# Patient Record
Sex: Male | Born: 1980 | Race: Black or African American | Hispanic: No | Marital: Single | Smoking: Never smoker
Health system: Southern US, Community
[De-identification: ages and names within clinical notes are randomized; demographics above are authoritative.]

## PROBLEM LIST (undated history)

## (undated) HISTORY — PX: TONSILLECTOMY: SUR1361

---

## 2015-05-10 ENCOUNTER — Encounter (HOSPITAL_COMMUNITY): Payer: Self-pay | Admitting: Emergency Medicine

## 2015-05-10 ENCOUNTER — Emergency Department (HOSPITAL_COMMUNITY): Payer: BLUE CROSS/BLUE SHIELD

## 2015-05-10 ENCOUNTER — Emergency Department (HOSPITAL_COMMUNITY)
Admission: EM | Admit: 2015-05-10 | Discharge: 2015-05-10 | Disposition: A | Discharge status: Home / Self Care | Payer: BLUE CROSS/BLUE SHIELD | Attending: Emergency Medicine | Admitting: Emergency Medicine

## 2015-05-10 DIAGNOSIS — R0602 Shortness of breath: Secondary | ICD-10-CM | POA: Diagnosis present

## 2015-05-10 DIAGNOSIS — J159 Unspecified bacterial pneumonia: Secondary | ICD-10-CM | POA: Insufficient documentation

## 2015-05-10 DIAGNOSIS — J9801 Acute bronchospasm: Secondary | ICD-10-CM | POA: Diagnosis not present

## 2015-05-10 DIAGNOSIS — J189 Pneumonia, unspecified organism: Secondary | ICD-10-CM

## 2015-05-10 LAB — CBC WITH DIFFERENTIAL/PLATELET
BASOS ABS: 0 10*3/uL (ref 0.0–0.1)
BASOS PCT: 0 %
Eosinophils Absolute: 0.7 10*3/uL (ref 0.0–0.7)
Eosinophils Relative: 15 %
HEMATOCRIT: 42.7 % (ref 39.0–52.0)
Hemoglobin: 14.6 g/dL (ref 13.0–17.0)
Lymphocytes Relative: 35 %
Lymphs Abs: 1.7 10*3/uL (ref 0.7–4.0)
MCH: 30 pg (ref 26.0–34.0)
MCHC: 34.2 g/dL (ref 30.0–36.0)
MCV: 87.7 fL (ref 78.0–100.0)
Monocytes Absolute: 0.4 10*3/uL (ref 0.1–1.0)
Monocytes Relative: 7 %
NEUTROS ABS: 2.1 10*3/uL (ref 1.7–7.7)
NEUTROS PCT: 43 %
Platelets: 171 10*3/uL (ref 150–400)
RBC: 4.87 MIL/uL (ref 4.22–5.81)
RDW: 12.6 % (ref 11.5–15.5)
WBC: 4.8 10*3/uL (ref 4.0–10.5)

## 2015-05-10 LAB — BASIC METABOLIC PANEL
ANION GAP: 6 (ref 5–15)
BUN: 23 mg/dL — ABNORMAL HIGH (ref 6–20)
CALCIUM: 8.7 mg/dL — AB (ref 8.9–10.3)
CO2: 27 mmol/L (ref 22–32)
Chloride: 107 mmol/L (ref 101–111)
Creatinine, Ser: 1.82 mg/dL — ABNORMAL HIGH (ref 0.61–1.24)
GFR, EST AFRICAN AMERICAN: 54 mL/min — AB (ref >60)
GFR, EST NON AFRICAN AMERICAN: 46 mL/min — AB (ref >60)
GLUCOSE: 101 mg/dL — AB (ref 65–99)
POTASSIUM: 3.3 mmol/L — AB (ref 3.5–5.1)
Sodium: 140 mmol/L (ref 135–145)

## 2015-05-10 MED ORDER — PREDNISONE 20 MG PO TABS: 40.0000 mg | ORAL_TABLET | Freq: Every day | ORAL | Status: AC

## 2015-05-10 MED ORDER — LORATADINE 10 MG PO TABS: 10.0000 mg | ORAL_TABLET | Freq: Every day | ORAL | Status: DC

## 2015-05-10 MED ORDER — DEXTROSE 5 % IV SOLN
1.0000 g | Freq: Once | INTRAVENOUS | Status: AC
  Administered 2015-05-10: 1 g via INTRAVENOUS
  Filled 2015-05-10: qty 10

## 2015-05-10 MED ORDER — AZITHROMYCIN 250 MG PO TABS: 250.0000 mg | ORAL_TABLET | Freq: Every day | ORAL | Status: AC

## 2015-05-10 MED ORDER — LORATADINE 10 MG PO TABS: 10.0000 mg | ORAL_TABLET | Freq: Every day | ORAL | Status: AC

## 2015-05-10 MED ORDER — ONDANSETRON HCL 4 MG/2ML IJ SOLN
4.0000 mg | Freq: Once | INTRAMUSCULAR | Status: AC
  Administered 2015-05-10: 4 mg via INTRAVENOUS
  Filled 2015-05-10: qty 2

## 2015-05-10 MED ORDER — ALBUTEROL (5 MG/ML) CONTINUOUS INHALATION SOLN
10.0000 mg/h | INHALATION_SOLUTION | RESPIRATORY_TRACT | Status: AC
  Administered 2015-05-10: 10 mg/h via RESPIRATORY_TRACT
  Filled 2015-05-10: qty 20

## 2015-05-10 MED ORDER — METHYLPREDNISOLONE SODIUM SUCC 125 MG IJ SOLR
125.0000 mg | Freq: Once | INTRAMUSCULAR | Status: AC
  Administered 2015-05-10: 125 mg via INTRAVENOUS
  Filled 2015-05-10: qty 2

## 2015-05-10 MED ORDER — ALBUTEROL SULFATE (2.5 MG/3ML) 0.083% IN NEBU
5.0000 mg | INHALATION_SOLUTION | Freq: Once | RESPIRATORY_TRACT | Status: AC
  Administered 2015-05-10: 5 mg via RESPIRATORY_TRACT
  Filled 2015-05-10: qty 6

## 2015-05-10 MED ORDER — SODIUM CHLORIDE 0.9 % IV BOLUS (SEPSIS)
1000.0000 mL | Freq: Once | INTRAVENOUS | Status: AC
  Administered 2015-05-10: 1000 mL via INTRAVENOUS

## 2015-05-10 MED ORDER — IPRATROPIUM BROMIDE 0.02 % IN SOLN
0.5000 mg | Freq: Once | RESPIRATORY_TRACT | Status: AC
  Administered 2015-05-10: 0.5 mg via RESPIRATORY_TRACT
  Filled 2015-05-10: qty 2.5

## 2015-05-10 MED ORDER — ALBUTEROL SULFATE HFA 108 (90 BASE) MCG/ACT IN AERS
2.0000 | INHALATION_SPRAY | Freq: Once | RESPIRATORY_TRACT | Status: AC
  Administered 2015-05-10: 2 via RESPIRATORY_TRACT
  Filled 2015-05-10: qty 6.7

## 2015-05-10 MED ORDER — MAGNESIUM SULFATE 2 GM/50ML IV SOLN: 2.0000 g | INTRAVENOUS | Status: DC

## 2015-05-10 NOTE — ED Notes (Addendum)
Pt ambulated over 50 ft.  Pt's O2 remained at 91% and  92%.  Pt's pulse rate was 87.   Pt maintained a steady gait and ambulated without assistance.

## 2015-05-10 NOTE — ED Notes (Signed)
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 Pt has no respiratory history or asthma. Pt has bilateral wheezes and diminished lung sounds. Pt is also bradycardic at time of assessment. Pt has no known history of any cardiac issues

## 2015-05-10 NOTE — Discharge Instructions (Signed)
Take azithromycin and prednisone as prescribed. Use an albuterol inhaler, 2 puffs every 4-6 hours, as needed for cough and shortness of breath. Follow up with your primary care doctor. Return if symptoms worsen.  Community-Acquired Pneumonia, Adult Pneumonia is an infection of the lungs. There are different types of pneumonia. One type can develop while a person is in a hospital. A different type, called community-acquired pneumonia, develops in people who are not, or have not recently been, in the hospital or other health care facility.  CAUSES Pneumonia may be caused by bacteria, viruses, or funguses. Community-acquired pneumonia is often caused by Streptococcus pneumonia bacteria. These bacteria are often passed from one person to another by breathing in droplets from the cough or sneeze of an infected person. RISK FACTORS The condition is more likely to develop in:  People who havechronic diseases, such as chronic obstructive pulmonary disease (COPD), asthma, congestive heart failure, cystic fibrosis, diabetes, or kidney disease.  People who haveearly-stage or late-stage HIV.  People who havesickle cell disease.  People who havehad their spleen removed (splenectomy).  People who havepoor Administrator.  People who havemedical conditions that increase the risk of breathing in (aspirating) secretions their own mouth and nose.   People who havea weakened immune system (immunocompromised).  People who smoke.  People whotravel to areas where pneumonia-causing germs commonly exist.  People whoare around animal habitats or animals that have pneumonia-causing germs, including birds, bats, rabbits, cats, and farm animals. SYMPTOMS Symptoms of this condition include:  Adry cough.  A wet (productive) cough.  Fever.  Sweating.  Chest pain, especially when breathing deeply or coughing.  Rapid breathing or difficulty breathing.  Shortness of breath.  Shaking  chills.  Fatigue.  Muscle aches. DIAGNOSIS Your health care provider will take a medical history and perform a physical exam. You may also have other tests, including:  Imaging studies of your chest, including X-rays.  Tests to check your blood oxygen level and other blood gases.  Other tests on blood, mucus (sputum), fluid around your lungs (pleural fluid), and urine. If your pneumonia is severe, other tests may be done to identify the specific cause of your illness. TREATMENT The type of treatment that you receive depends on many factors, such as the cause of your pneumonia, the medicines you take, and other medical conditions that you have. For most adults, treatment and recovery from pneumonia may occur at home. In some cases, treatment must happen in a hospital. Treatment may include:  Antibiotic medicines, if the pneumonia was caused by bacteria.  Antiviral medicines, if the pneumonia was caused by a virus.  Medicines that are given by mouth or through an IV tube.  Oxygen.  Respiratory therapy. Although rare, treating severe pneumonia may include:  Mechanical ventilation. This is done if you are not breathing well on your own and you cannot maintain a safe blood oxygen level.  Thoracentesis. This procedureremoves fluid around one lung or both lungs to help you breathe better. HOME CARE INSTRUCTIONS  Take over-the-counter and prescription medicines only as told by your health care provider.  Only takecough medicine if you are losing sleep. Understand that cough medicine can prevent your body's natural ability to remove mucus from your lungs.  If you were prescribed an antibiotic medicine, take it as told by your health care provider. Do not stop taking the antibiotic even if you start to feel better.  Sleep in a semi-upright position at night. Try sleeping in a reclining chair, or place a  few pillows under your head.  Do not use tobacco products, including cigarettes,  chewing tobacco, and e-cigarettes. If you need help quitting, ask your health care provider.  Drink enough water to keep your urine clear or pale yellow. This will help to thin out mucus secretions in your lungs. PREVENTION There are ways that you can decrease your risk of developing community-acquired pneumonia. Consider getting a pneumococcal vaccine if:  You are older than 37 years of age.  You are older than 37 years of age and are undergoing cancer treatment, have chronic lung disease, or have other medical conditions that affect your immune system. Ask your health care provider if this applies to you. There are different types and schedules of pneumococcal vaccines. Ask your health care provider which vaccination option is best for you. You may also prevent community-acquired pneumonia if you take these actions:  Get an influenza vaccine every year. Ask your health care provider which type of influenza vaccine is best for you.  Go to the dentist on a regular basis.  Wash your hands often. Use hand sanitizer if soap and water are not available. SEEK MEDICAL CARE IF:  You have a fever.  You are losing sleep because you cannot control your cough with cough medicine. SEEK IMMEDIATE MEDICAL CARE IF:  You have worsening shortness of breath.  You have increased chest pain.  Your sickness becomes worse, especially if you are an older adult or have a weakened immune system.  You cough up blood.   This information is not intended to replace advice given to you by your health care provider. Make sure you discuss any questions you have with your health care provider.   Document Released: 01/10/2005 Document Revised: 10/01/2014 Document Reviewed: 05/07/2014 Elsevier Interactive Patient Education [employer]2016 [employer] Inc.

## 2015-05-10 NOTE — ED Provider Notes (Signed)
CSN: [id number]     Arrival date & time 05/10/15  0019 History   First MD Initiated Contact with Patient 05/10/15 0303     Chief Complaint  Patient presents with  . Shortness of Breath     (Consider location/radiation/quality/duration/timing/severity/associated sxs/prior Treatment) HPI Comments: Patient is an otherwise healthy 35 year old male who presents to the emergency department for worsening shortness of breath and chest tightness. Patient states that his symptoms have been progressive over the past 4 days. He reports constant worsening symptoms and denies taking any medications prior to arrival for relief. He denies a history of asthma and states that he has been a never smoker. He further denies fever, cough, nasal congestion, rhinorrhea, nausea, vomiting, or history of seasonal allergies. He has not been around any sick contacts. Albuterol treatments given in triage. Patient reports mild improvement with this.  Patient is a 35 y.o. male presenting with shortness of breath. The history is provided by the patient. No language interpreter was used.  Shortness of Breath Associated symptoms: wheezing   Associated symptoms: no cough, no fever and no vomiting     History reviewed. No pertinent past medical history. Past Surgical History  Procedure Laterality Date  . Tonsillectomy     No family history on file. Social History  Substance Use Topics  . Smoking status: Never Smoker   . Smokeless tobacco: None  . Alcohol Use: No    Review of Systems  Constitutional: Negative for fever.  HENT: Negative for congestion and rhinorrhea.   Respiratory: Positive for chest tightness, shortness of breath and wheezing. Negative for cough.   Gastrointestinal: Negative for nausea and vomiting.  Neurological: Negative for syncope.  All other systems reviewed and are negative.   Allergies  Shellfish allergy  Home Medications   Prior to Admission medications   Medication Sig Start Date  End Date Taking? Authorizing Provider  aspirin-acetaminophen-caffeine (EXCEDRIN MIGRAINE) [phone] MG tablet Take 2 tablets by mouth every 6 (six) hours as needed (for current symptoms--shortness of breath).   Yes Historical Provider, MD  OVER THE COUNTER MEDICATION Take 1 scoop by mouth 3 (three) times a week. C4 Pre Workout Supplement Ingredients:  Vitamin C (as Ascorbic Acid) Niacin (as Niacinamide) Folic Acid  Calcium (as Calcium Silicate) Vitamin B6 (as pyridoxal-5-phosphate)   Vitamin B12 (as Methylcobalamin)   Citrulline Malate  CarnoSyn Beta-Alanine  Creatine Nitrate (NO3-T)  Betaine Anhydrous   Nitrosigine Inositol-Stabilized Arginine Silicate  Arginine Alpha Ketoglutarate   Taurine  C4 [employer] Focus Blend     N-Acetyl-L-Tyrosine      Caffeine Anhydrous    Velvet Bean (Mucuna pruriens) seed extract (standardized for L-Dopa)     Theacrine (as TeaCrine)      AlphaSize; A-GPC (Alpha-Glyceryl Phosphoryl Choline)      Zembrin (Sceletium tortuosum) aerial parts extract      Rauwolfia vomitoria root bark extract      Huperzine A (from Toothed Clubmoss (Huperzia serrata) aerial parts extract)   Yes Historical Provider, MD  azithromycin (ZITHROMAX) 250 MG tablet Take 1 tablet (250 mg total) by mouth daily. Take first 2 tablets together, then 1 every day until finished. 05/10/15   Antony Madura, PA-C  loratadine (CLARITIN) 10 MG tablet Take 1 tablet (10 mg total) by mouth daily. 05/10/15   Antony Madura, PA-C  predniSONE (DELTASONE) 20 MG tablet Take 2 tablets (40 mg total) by mouth daily. 05/10/15   Antony Madura, PA-C   BP 133/67 mmHg  Pulse 81  Temp(Src) 97.9  F (36.6 C) (Oral)  Resp 28  Ht 6\' 3"  (1.905 m)  Wt 104.327 kg  BMI 28.75 kg/m2  SpO2 93%   Physical Exam  Constitutional: He is oriented to person, place, and time. He appears well-developed and well-nourished. No distress.  Nontoxic/nonseptic appearing. Patient in no acute distress.  HENT:  Head: Normocephalic  and atraumatic.  Mouth/Throat: Oropharynx is clear and moist. No oropharyngeal exudate.  Eyes: Conjunctivae and EOM are normal. No scleral icterus.  Neck: Normal range of motion.  Cardiovascular: Normal rate, regular rhythm and intact distal pulses.   Pulmonary/Chest: Effort normal. No respiratory distress. He has wheezes. He has no rales.  Diffuse expiratory wheezing heard in all lung fields bilaterally. No rales appreciated. Chest expansion symmetric. No accessory muscle use  Musculoskeletal: Normal range of motion.  Neurological: He is alert and oriented to person, place, and time. He exhibits normal muscle tone. Coordination normal.  GCS 15. Patient moving all extremities.  Skin: Skin is warm and dry. No rash noted. He is not diaphoretic. No erythema. No pallor.  Psychiatric: He has a normal mood and affect. His behavior is normal.  Nursing note and vitals reviewed.   ED Course  Procedures (including critical care time) Labs Review Labs Reviewed  BASIC METABOLIC PANEL - Abnormal; Notable for the following:    Potassium 3.3 (*)    Glucose, Bld 101 (*)    BUN 23 (*)    Creatinine, Ser 1.82 (*)    Calcium 8.7 (*)    GFR calc non Af Amer 46 (*)    GFR calc Af Amer 54 (*)    All other components within normal limits  CBC WITH DIFFERENTIAL/PLATELET    Imaging Review Dg Chest 2 View  05/10/2015  CLINICAL DATA:  Initial evaluation for acute cough, shortness of breath, wheezing. EXAM: CHEST  2 VIEW COMPARISON:  None. FINDINGS: Cardiac and mediastinal silhouettes are within normal limits. Trach air column midline and patent. Lungs are normally inflated. There is hazy opacity/infiltrate within the perihilar region in the right upper lobe, suspicious for possible pneumonia. No other definite focal airspace disease. No pulmonary edema or pleural effusion. No pneumothorax. No acute osseous abnormality. IMPRESSION: Hazy right upper lobe opacity, suspicious for possible pneumonia given the  provided history of cough and shortness of breath. Electronically Signed   By: Rise MuBenjamin  McClintock M.D.   On: 05/10/2015 02:34   I have personally reviewed and evaluated these images and lab results as part of my medical decision-making.   EKG Interpretation   Date/Time:  Sunday May 10 2015 00:28:34 EDT Ventricular Rate:  49 PR Interval:  198 QRS Duration: 110 QT Interval:  434 QTC Calculation: 392 R Axis:   73 Text Interpretation:  Sinus bradycardia Confirmed by HORTON  MD, Toni AmendOURTNEY  ([id number]) on 05/10/2015 4:41:15 AM      MDM   Final diagnoses:  CAP (community acquired pneumonia)  Acute bronchospasm    35 year old afebrile and nontoxic appearing male presents to the emergency department for further evaluation of worsening shortness of breath x 4 days. Patient with expiratory wheezing diffusely on initial auscultation following 2 albuterol nebulizer treatments. Patient with chest x-ray that shows a hazy right upper lobe opacity concerning for possible pneumonia. Patient has been given an initial dose of IV Rocephin. Plan to discharge with azithromycin.  Patient with mild to moderate improvement in lung sounds on reexamination following continuous albuterol treatment and IV steroids. Patient also given IV fluids in light of his  elevated creatinine. Laboratory workup is otherwise noncontributory. Patient is ambulatory in the emergency department without hypoxia. He states that he is comfortable with discharge and further outpatient management. I have requested that he return to the emergency department for worsening symptoms, especially if he feels as though his shortness of breath is not well controlled with an albuterol inhaler. Return precautions given at discharge. Patient discharged in satisfactory condition with no unaddressed concerns.   Filed Vitals:   05/10/15 0600 05/10/15 0615 05/10/15 0630 05/10/15 0701  BP: 125/59 126/67 128/70 133/67  Pulse: 86  86 81  Temp:    97.9 F  (36.6 C)  TempSrc:    Oral  Resp: Height:      Weight:      SpO2: 87%  91% 93%     Antony Madura, PA-C 05/10/15 1610  Shon Baton, MD 05/10/15 [phone]

## 2016-05-14 ENCOUNTER — Emergency Department (HOSPITAL_COMMUNITY)
Admission: EM | Admit: 2016-05-14 | Discharge: 2016-05-14 | Disposition: A | Discharge status: Home / Self Care | Payer: BLUE CROSS/BLUE SHIELD | Attending: Emergency Medicine | Admitting: Emergency Medicine

## 2016-05-14 ENCOUNTER — Emergency Department (HOSPITAL_COMMUNITY): Payer: BLUE CROSS/BLUE SHIELD

## 2016-05-14 ENCOUNTER — Encounter (HOSPITAL_COMMUNITY): Payer: Self-pay | Admitting: Oncology

## 2016-05-14 DIAGNOSIS — Z79899 Other long term (current) drug therapy: Secondary | ICD-10-CM | POA: Insufficient documentation

## 2016-05-14 DIAGNOSIS — R0602 Shortness of breath: Secondary | ICD-10-CM

## 2016-05-14 DIAGNOSIS — Z7982 Long term (current) use of aspirin: Secondary | ICD-10-CM | POA: Insufficient documentation

## 2016-05-14 DIAGNOSIS — R062 Wheezing: Secondary | ICD-10-CM | POA: Diagnosis not present

## 2016-05-14 DIAGNOSIS — J208 Acute bronchitis due to other specified organisms: Secondary | ICD-10-CM

## 2016-05-14 MED ORDER — ALBUTEROL SULFATE HFA 108 (90 BASE) MCG/ACT IN AERS: 2.0000 | INHALATION_SPRAY | RESPIRATORY_TRACT | 0 refills | Status: AC | PRN

## 2016-05-14 MED ORDER — ALBUTEROL SULFATE HFA 108 (90 BASE) MCG/ACT IN AERS
2.0000 | INHALATION_SPRAY | Freq: Once | RESPIRATORY_TRACT | Status: AC
  Administered 2016-05-14: 2 via RESPIRATORY_TRACT
  Filled 2016-05-14: qty 6.7

## 2016-05-14 MED ORDER — ALBUTEROL SULFATE (2.5 MG/3ML) 0.083% IN NEBU
5.0000 mg | INHALATION_SOLUTION | Freq: Once | RESPIRATORY_TRACT | Status: AC
  Administered 2016-05-14: 5 mg via RESPIRATORY_TRACT
  Filled 2016-05-14: qty 6

## 2016-05-14 NOTE — ED Provider Notes (Signed)
[facility]-EMERGENCY DEPT Provider Note   CSN: [id number] Arrival date & time: 05/14/16  4098     History   Chief Complaint Chief Complaint  Patient presents with  . Shortness of Breath    HPI Andre Suarez. is a 36 y.o. male.  The history is provided by the patient.  Shortness of Breath  This is a new problem. The average episode lasts 3 days. The problem occurs frequently.The current episode started more than 2 days ago. The problem has not changed since onset.Associated symptoms include rhinorrhea, cough and wheezing. Pertinent negatives include no fever, no sore throat, no sputum production, no hemoptysis, no PND, no orthopnea, no chest pain, no syncope, no vomiting, no abdominal pain, no leg pain and no leg swelling. It is unknown what precipitated the problem. He has tried beta-agonist inhalers for the symptoms. The treatment provided moderate relief. He has had no prior hospitalizations. He has had prior ED visits. He has had no prior ICU admissions.    36 year old male who presents with shortness of breath. States that he has been having exertional dyspnea over the past 3-4 days, with wheezing that started this morning. Symptoms improved with rest. Has had cough with minimal sputum production. No fevers, sore throat, runny nose or significant congestion. No lower extremity edema or calf tenderness. No recent immobilization, prior history of DVT/PE or family history of this. No significant chest pain. Currently is a nonsmoker. Is given albuterol in the emergency department with improvement. Does state that is this feels similar to when he has had a prior episode of pneumonia one year ago.   History reviewed. No pertinent past medical history.  There are no active problems to display for this patient.   Past Surgical History:  Procedure Laterality Date  . TONSILLECTOMY         Home Medications    Prior to Admission medications   Medication Sig Start Date End Date  Taking? Authorizing Provider  albuterol (PROVENTIL HFA;VENTOLIN HFA) 108 (90 Base) MCG/ACT inhaler Inhale 2 puffs into the lungs every 4 (four) hours as needed for wheezing or shortness of breath. 05/14/16   Lavera Guise, MD  aspirin-acetaminophen-caffeine (EXCEDRIN MIGRAINE) [phone] MG tablet Take 2 tablets by mouth every 6 (six) hours as needed (for current symptoms--shortness of breath).    Historical Provider, MD  azithromycin (ZITHROMAX) 250 MG tablet Take 1 tablet (250 mg total) by mouth daily. Take first 2 tablets together, then 1 every day until finished. 05/10/15   Antony Madura, PA-C  loratadine (CLARITIN) 10 MG tablet Take 1 tablet (10 mg total) by mouth daily. 05/10/15   Antony Madura, PA-C  OVER THE COUNTER MEDICATION Take 1 scoop by mouth 3 (three) times a week. C4 Pre Workout Supplement Ingredients:  Vitamin C (as Ascorbic Acid) Niacin (as Niacinamide) Folic Acid  Calcium (as Calcium Silicate) Vitamin B6 (as pyridoxal-5-phosphate)   Vitamin B12 (as Methylcobalamin)   Citrulline Malate  CarnoSyn Beta-Alanine  Creatine Nitrate (NO3-T)  Betaine Anhydrous   Nitrosigine Inositol-Stabilized Arginine Silicate  Arginine Alpha Ketoglutarate   Taurine  C4 [employer] Focus Blend     N-Acetyl-L-Tyrosine      Caffeine Anhydrous    Velvet Bean (Mucuna pruriens) seed extract (standardized for L-Dopa)     Theacrine (as TeaCrine)      AlphaSize; A-GPC (Alpha-Glyceryl Phosphoryl Choline)      Zembrin (Sceletium tortuosum) aerial parts extract      Rauwolfia vomitoria root bark extract  Huperzine A (from Toothed Clubmoss (Huperzia serrata) aerial parts extract)    Historical Provider, MD  predniSONE (DELTASONE) 20 MG tablet Take 2 tablets (40 mg total) by mouth daily. 05/10/15   Antony Madura, PA-C    Family History No family history on file.  Social History Social History  Substance Use Topics  . Smoking status: Never Smoker  . Smokeless tobacco: Never Used  . Alcohol use No      Allergies   Shellfish allergy   Review of Systems Review of Systems  Constitutional: Negative for fever.  HENT: Positive for rhinorrhea. Negative for sore throat.   Respiratory: Positive for cough, shortness of breath and wheezing. Negative for hemoptysis and sputum production.   Cardiovascular: Negative for chest pain, orthopnea, leg swelling, syncope and PND.  Gastrointestinal: Negative for abdominal pain and vomiting.  Allergic/Immunologic: Negative for immunocompromised state.  Hematological: Does not bruise/bleed easily.  Psychiatric/Behavioral: Negative for confusion.  All other systems reviewed and are negative.    Physical Exam Updated Vital Signs BP 121/78 (BP Location: Left Arm)   Pulse (!) 50   Temp 97.6 F (36.4 C) (Oral)   Resp 16   Ht  (1.905 m)   Wt 235 lb (106.6 kg)   SpO2 98%   BMI 29.37 kg/m   Physical Exam Physical Exam  Nursing note and vitals reviewed. Constitutional: Well developed, well nourished, non-toxic, and in no acute distress Head: Normocephalic and atraumatic.  Mouth/Throat: Oropharynx is clear and moist.  Neck: Normal range of motion. Neck supple.  Cardiovascular: Normal rate and regular rhythm.   Pulmonary/Chest: Effort normal and breath sounds normal.  Abdominal: Soft. There is no tenderness. There is no rebound and no guarding.  Musculoskeletal: Normal range of motion.  no lower extremity edema. No calf tenderness  Neurological: Alert, no facial droop, fluent speech, moves all extremities symmetrically Skin: Skin is warm and dry.  Psychiatric: Cooperative   ED Treatments / Results  Labs (all labs ordered are listed, but only abnormal results are displayed) Labs Reviewed - No data to display  EKG  EKG Interpretation  Date/Time:  Saturday May 14 2016 06:55:54 EDT Ventricular Rate:  44 PR Interval:    QRS Duration: 112 QT Interval:  454 QTC Calculation: 389 R Axis:   78 Text Interpretation:  Sinus bradycardia  Borderline intraventricular conduction delay ST elev, probable normal early repol pattern no acute changes  Confirmed by Daishon Chui MD, Ilham Roughton [phone]) on 05/14/2016 8:15:02 AM       Radiology Dg Chest 2 View  Result Date: 05/14/2016 CLINICAL DATA:  Patient with chest pain for 3 days. Shortness of breath. EXAM: CHEST  2 VIEW COMPARISON:  Chest radiograph 05/10/2015. FINDINGS: Monitoring leads overlie the patient. Normal cardiac and mediastinal contours. No consolidative pulmonary opacities. No pleural effusion or pneumothorax. Thoracic spine degenerative changes. IMPRESSION: No active cardiopulmonary disease. Electronically Signed   By: Annia Belt M.D.   On: 05/14/2016 07:25    Procedures Procedures (including critical care time)  Medications Ordered in ED Medications  albuterol (PROVENTIL HFA;VENTOLIN HFA) 108 (90 Base) MCG/ACT inhaler 2 puff (not administered)  albuterol (PROVENTIL) (2.5 MG/3ML) 0.083% nebulizer solution 5 mg (5 mg Nebulization Given 05/14/16 0756)     Initial Impression / Assessment and Plan / ED Course  I have reviewed the triage vital signs and the nursing notes.  Pertinent labs & imaging results that were available during my care of the patient were reviewed by me and considered in my medical decision  making (see chart for details).     36 year old male who presents with shortness of breath, wheezing and dyspnea on exertion over the past 3-4 days. Received albuterol treatment prior to my evaluation, the lungs are clear, but states wheezing prior to treatment.  His vital signs are non-concerning. He is well-appearing, comfortable leaving on room air, in speaking full sentences. Chest x-ray is visualized and does not show pneumonia, edema, or any other acute cardiopulmonary processes. EKG with baseline sinus bradycardia, no evidence of ischemia or heart strain. He is perc negative and Wells criteria low risk. No concerns for PE at this time. Low risk for ACS.   He feels  improved after breathing treatment. Ambulates in ED with normal pulse ox and normal work of breathing. At this time felt to be stable for discharge with inhaler. No pneumonia to require antibiotics.  Strict return and follow-up instructions reviewed. He expressed understanding of all discharge instructions and felt comfortable with the plan of care.   Final Clinical Impressions(s) / ED Diagnoses   Final diagnoses:  Shortness of breath  Wheezing  Viral bronchitis    New Prescriptions New Prescriptions   ALBUTEROL (PROVENTIL HFA;VENTOLIN HFA) 108 (90 BASE) MCG/ACT INHALER    Inhale 2 puffs into the lungs every 4 (four) hours as needed for wheezing or shortness of breath.     Lavera Guise, MD 05/14/16 [id number]

## 2016-05-14 NOTE — ED Notes (Signed)
Pulse ox while ambulating was 96%-97%.

## 2016-05-14 NOTE — Discharge Instructions (Signed)
If you feel wheezing or short of breath, you can use your inhaler 2-8 puffs (6-8 puffs if you are winded) every 4 hours.  This may be a viral bronchitis causing your symptoms, which can take 1-2 weeks to improve.  Return for worsening symptoms, including worsening shortness of breath, severe chest pain, passing out, fevers, or any other symptoms concerning to you.

## 2016-05-14 NOTE — ED Triage Notes (Signed)
Pt c/o intermittent exertional shob.  Pt states shob is worse at work.  Pt is able to speak in full sentences.  Pt is ambulatory to triage.  Denies pain.  Lung sounds are diminished,  Pt states this is how he felt when he was dx w/ pneumonia in the past.

## 2017-06-13 IMAGING — CR DG CHEST 2V
2 series · 2 of 2 positions shown · non-contrast
Comparison: None.

CLINICAL DATA: Initial evaluation for acute cough, shortness of
breath, wheezing.

EXAM:
CHEST  2 VIEW

[w chest pa]
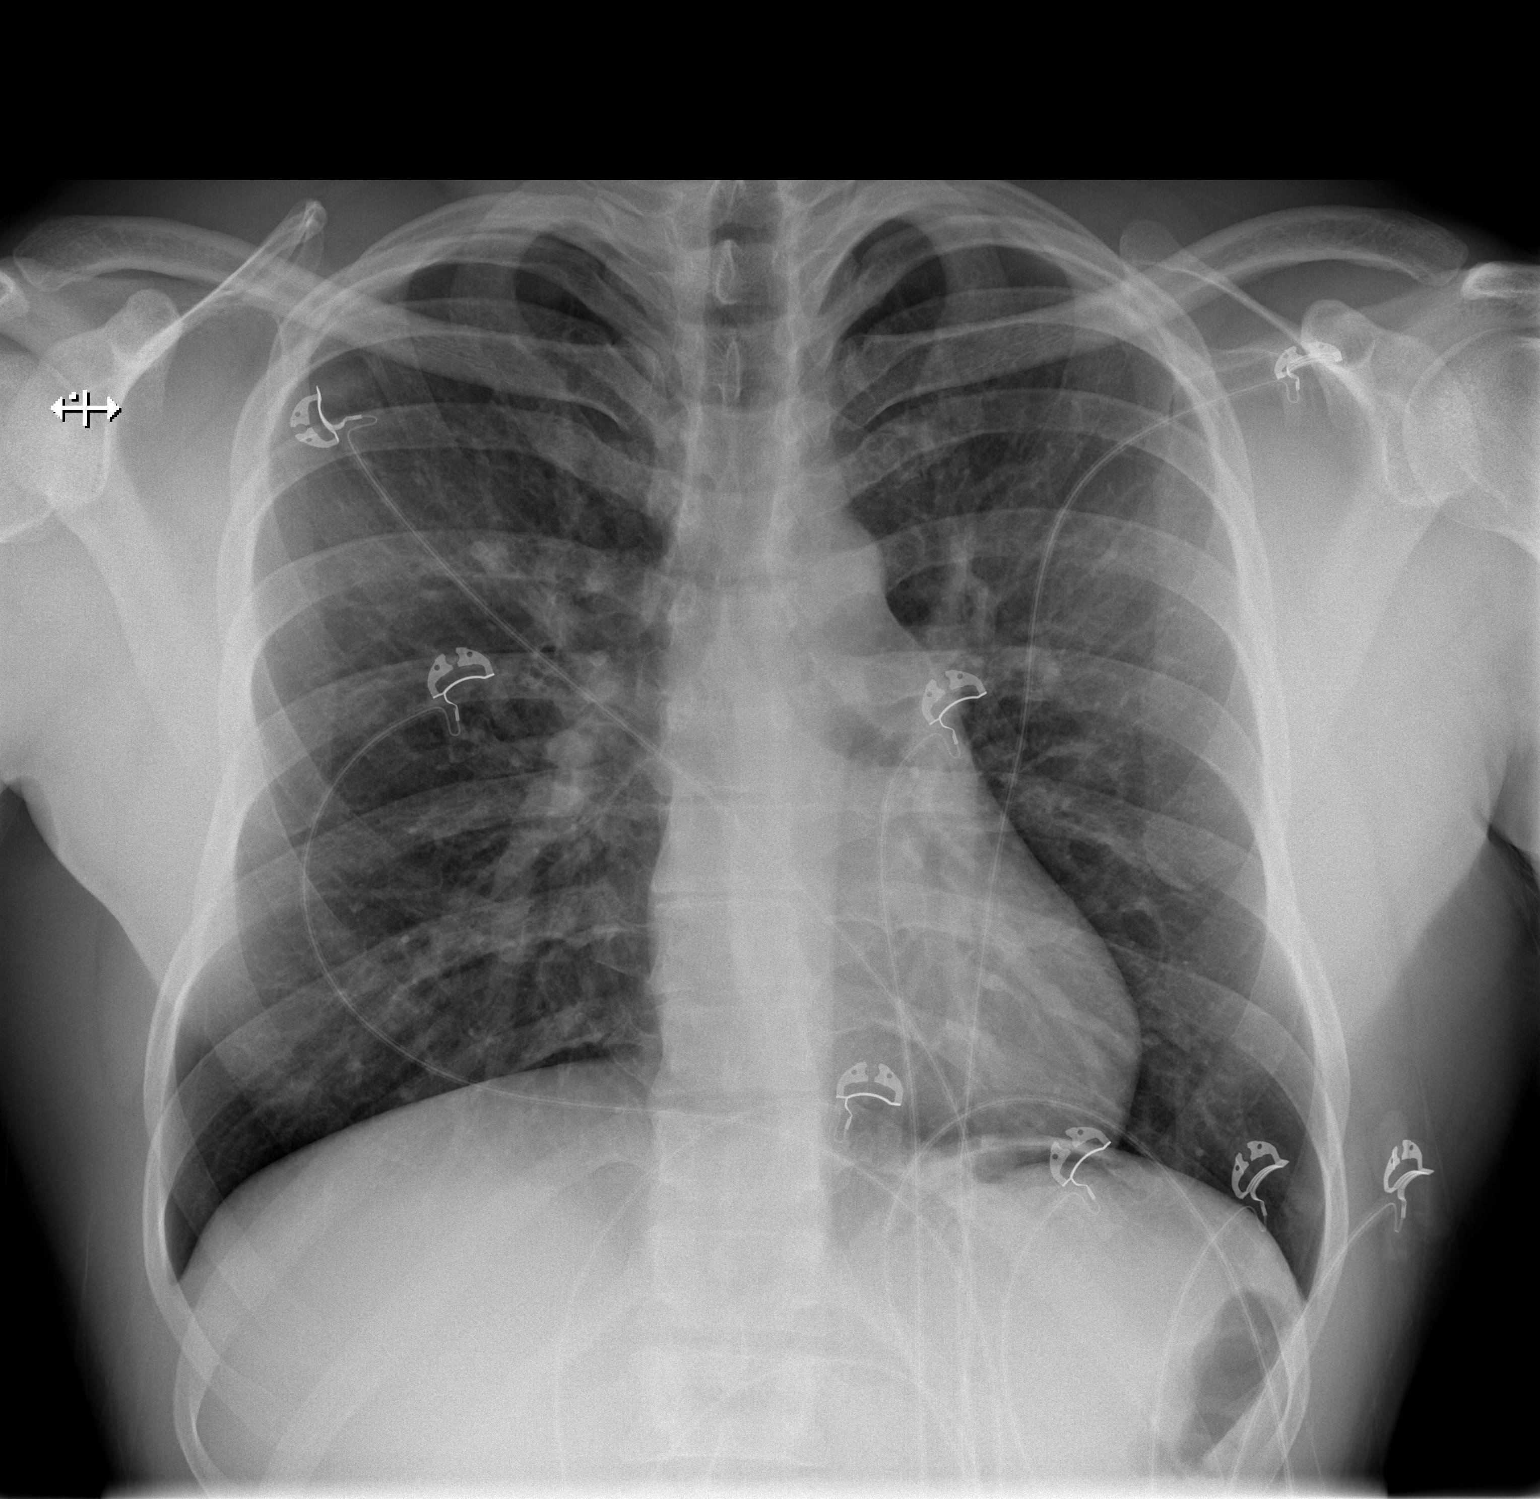

[w chest lat]
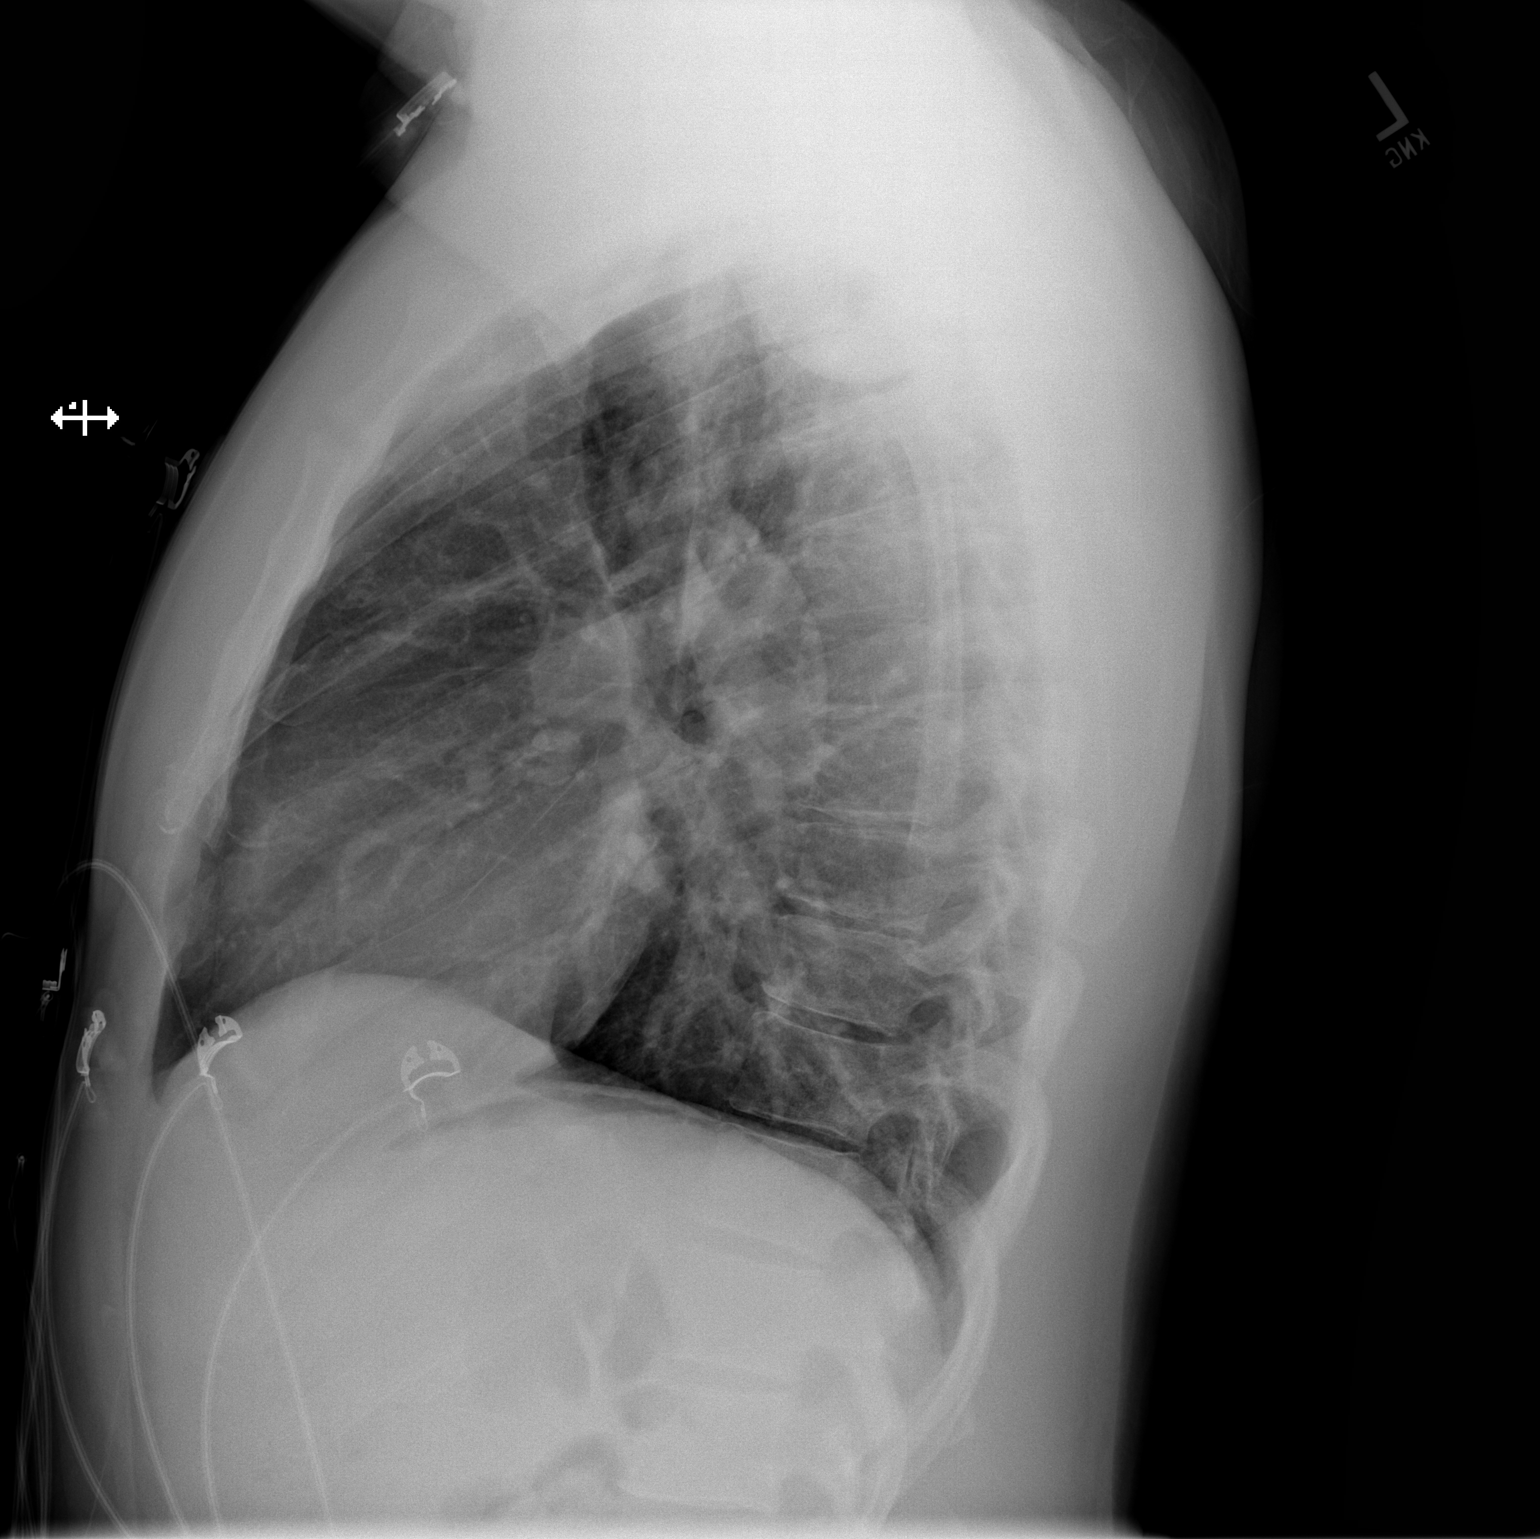

[2 of 2 positions shown; findings below may reference images not displayed]

FINDINGS: Cardiac and mediastinal silhouettes are within normal limits. Trach
air column midline and patent.

Lungs are normally inflated. There is hazy opacity/infiltrate within
the perihilar region in the right upper lobe, suspicious for
possible pneumonia. No other definite focal airspace disease. No
pulmonary edema or pleural effusion. No pneumothorax.

No acute osseous abnormality.
IMPRESSION: Hazy right upper lobe opacity, suspicious for possible pneumonia
given the provided history of cough and shortness of breath.

## 2018-06-18 IMAGING — CR DG CHEST 2V
2 series · 2 of 2 positions shown · non-contrast
Comparison: Chest radiograph 05/10/2015.

CLINICAL DATA: Patient with chest pain for 3 days. Shortness of
breath.

EXAM:
CHEST  2 VIEW

[w chest pa]
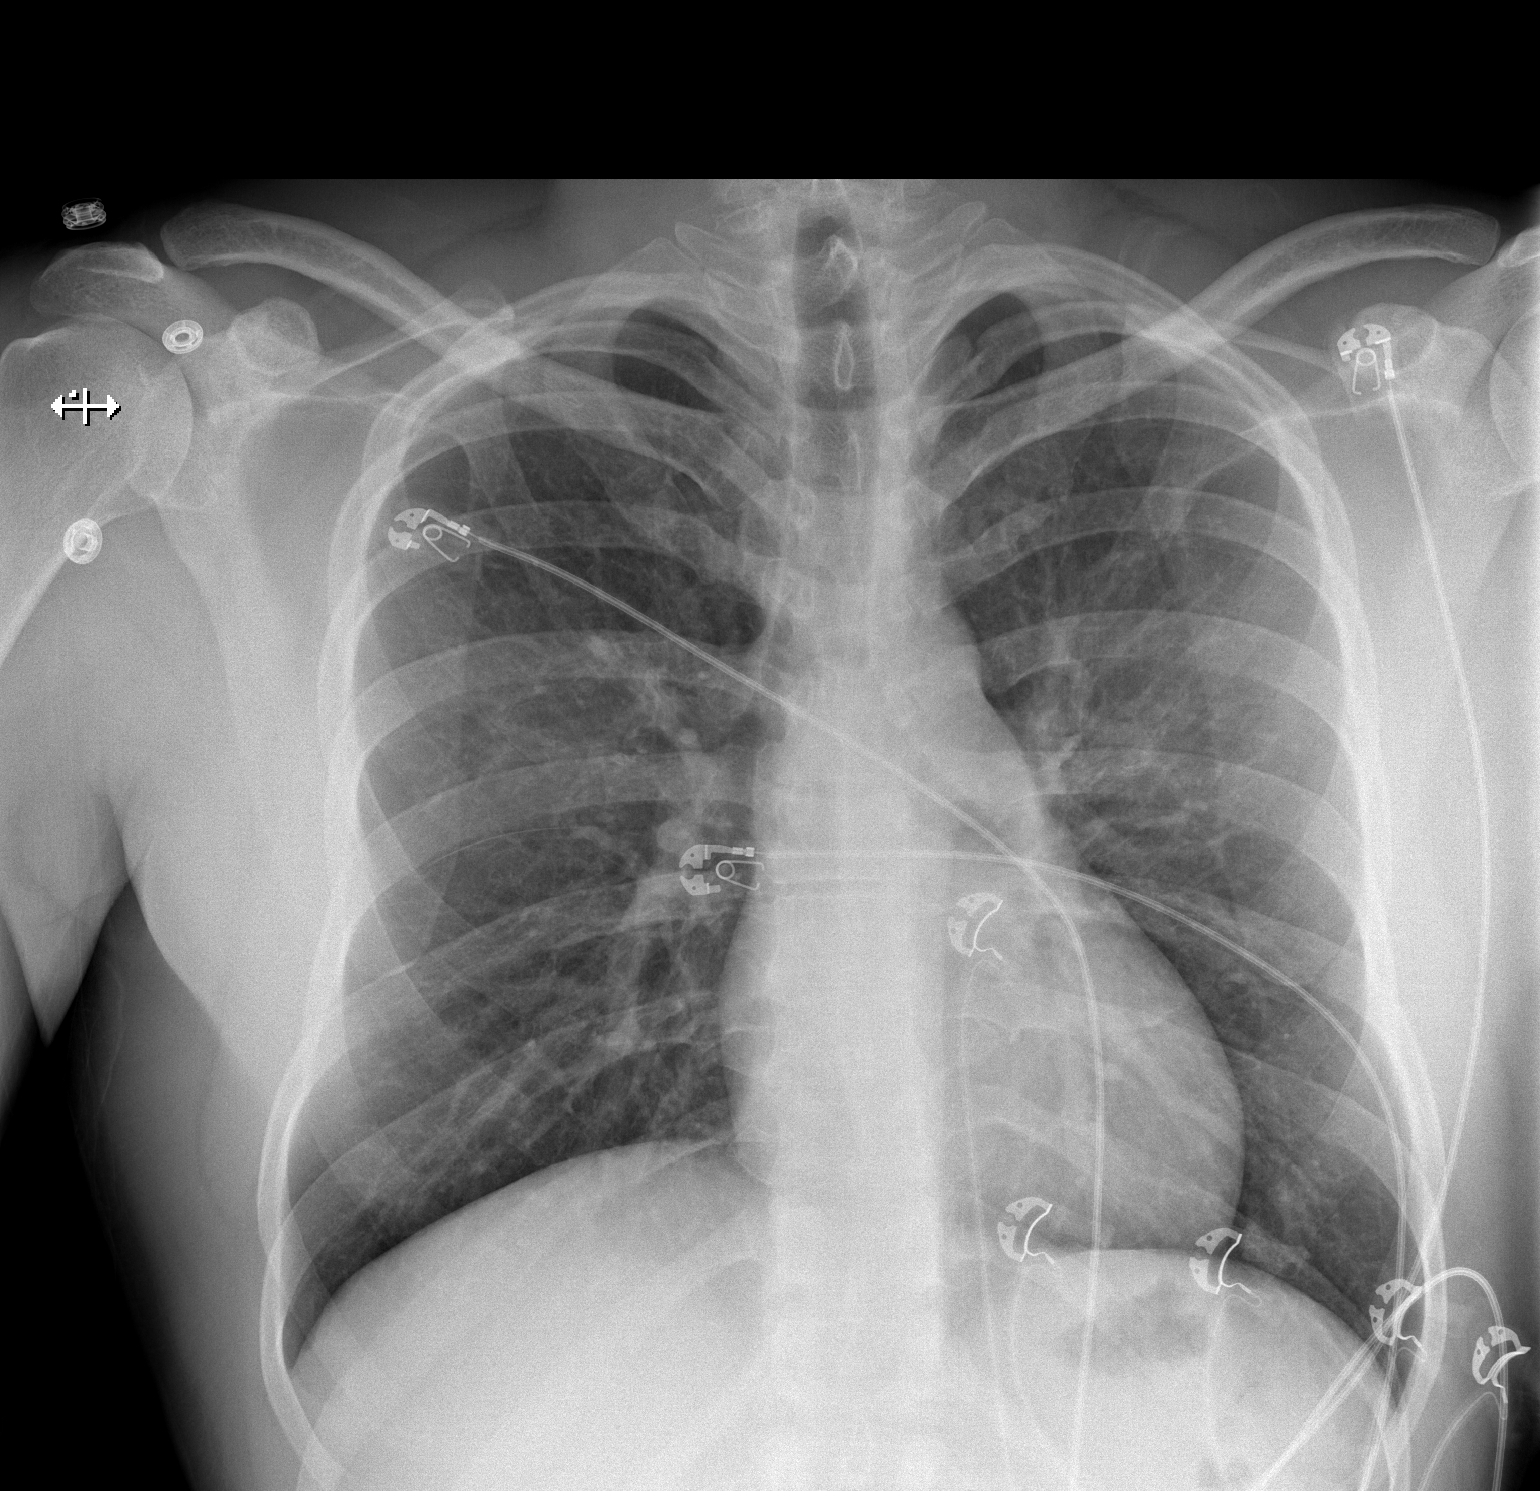

[w chest lat]
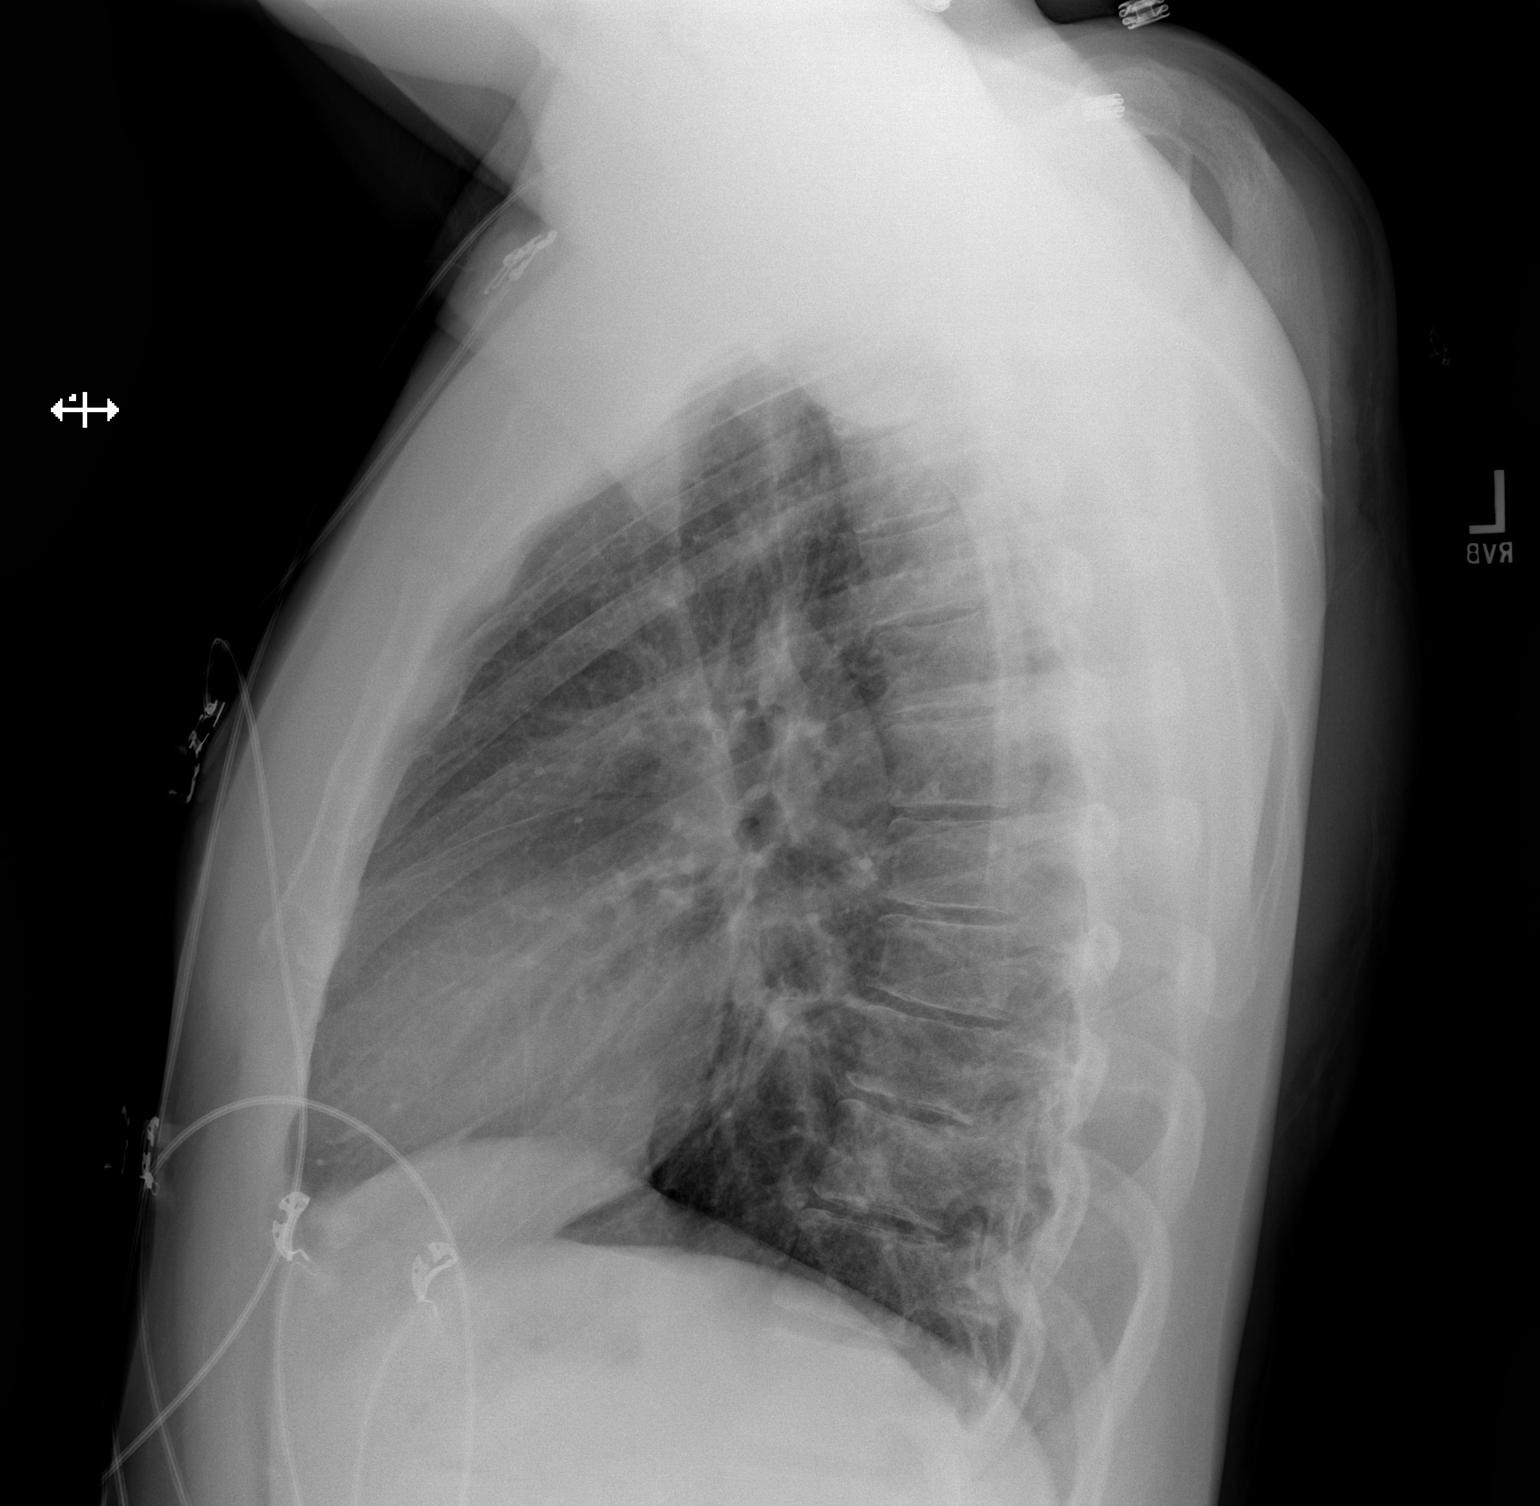

[2 of 2 positions shown; findings below may reference images not displayed]

FINDINGS: Monitoring leads overlie the patient. Normal cardiac and mediastinal
contours. No consolidative pulmonary opacities. No pleural effusion
or pneumothorax. Thoracic spine degenerative changes.
IMPRESSION: No active cardiopulmonary disease.

## 2021-08-24 ENCOUNTER — Encounter (HOSPITAL_COMMUNITY): Payer: Self-pay | Admitting: Emergency Medicine

## 2021-08-24 ENCOUNTER — Other Ambulatory Visit: Payer: Self-pay

## 2021-08-24 ENCOUNTER — Emergency Department (HOSPITAL_COMMUNITY)
Admission: EM | Admit: 2021-08-24 | Discharge: 2021-08-24 | Disposition: A | Discharge status: Home / Self Care | Payer: No Typology Code available for payment source | Attending: Emergency Medicine | Admitting: Emergency Medicine

## 2021-08-24 DIAGNOSIS — M791 Myalgia, unspecified site: Secondary | ICD-10-CM | POA: Diagnosis present

## 2021-08-24 DIAGNOSIS — Z7982 Long term (current) use of aspirin: Secondary | ICD-10-CM | POA: Insufficient documentation

## 2021-08-24 DIAGNOSIS — U071 COVID-19: Secondary | ICD-10-CM | POA: Diagnosis not present

## 2021-08-24 LAB — SARS CORONAVIRUS 2 BY RT PCR: SARS Coronavirus 2 by RT PCR: POSITIVE — AB

## 2021-08-24 MED ORDER — IBUPROFEN 800 MG PO TABS
800.0000 mg | ORAL_TABLET | Freq: Once | ORAL | Status: AC
  Administered 2021-08-24: 800 mg via ORAL
  Filled 2021-08-24: qty 1

## 2021-08-24 MED ORDER — ALBUTEROL SULFATE HFA 108 (90 BASE) MCG/ACT IN AERS
2.0000 | INHALATION_SPRAY | Freq: Once | RESPIRATORY_TRACT | Status: AC
  Administered 2021-08-24: 2 via RESPIRATORY_TRACT
  Filled 2021-08-24: qty 6.7

## 2021-08-24 NOTE — Discharge Instructions (Addendum)
Use 2 puffs of an albuterol inhaler every 4-6 hours for wheezing/cough/shortness of breath. Take Tylenol for fever and ibuprofen for body aches. When back to work, wear a mask for an additional 5 days. Follow up with a primary care doctor.

## 2021-08-24 NOTE — ED Triage Notes (Signed)
Patient reports generalized body aches and not feeling good x 1 day. Patient denies n/v/d.

## 2021-08-24 NOTE — ED Provider Notes (Signed)
[facility]-EMERGENCY DEPT Provider Note   CSN: [id number] Arrival date & time: 08/24/21  0349     History  Chief Complaint  Patient presents with   Generalized Body Aches    Andre Suarez. is a 41 y.o. male.  41 year old male presents to the emergency department for evaluation of generalized body aches.  Symptoms began this morning with associated fatigue and malaise, mild cough.  The patient has not taken any medications for symptoms.  Denies any known sick contacts.  He has not had any associated fever, nausea, vomiting, diarrhea, chest pain.  The patient is not vaccinated against COVID and denies ever having had COVID in the past.  The history is provided by the patient. No language interpreter was used.       Home Medications Prior to Admission medications   Medication Sig Start Date End Date Taking? Authorizing Provider  albuterol (PROVENTIL HFA;VENTOLIN HFA) 108 (90 Base) MCG/ACT inhaler Inhale 2 puffs into the lungs every 4 (four) hours as needed for wheezing or shortness of breath. 05/14/16   Lavera Guise, MD  aspirin-acetaminophen-caffeine (EXCEDRIN MIGRAINE) [phone] MG tablet Take 2 tablets by mouth every 6 (six) hours as needed (for current symptoms--shortness of breath).    [provider]  azithromycin (ZITHROMAX) 250 MG tablet Take 1 tablet (250 mg total) by mouth daily. Take first 2 tablets together, then 1 every day until finished. 05/10/15   Antony Madura, PA-C  loratadine (CLARITIN) 10 MG tablet Take 1 tablet (10 mg total) by mouth daily. 05/10/15   Antony Madura, PA-C  OVER THE COUNTER MEDICATION Take 1 scoop by mouth 3 (three) times a week. C4 Pre Workout Supplement Ingredients:  Vitamin C (as Ascorbic Acid) Niacin (as Niacinamide) Folic Acid  Calcium (as Calcium Silicate) Vitamin B6 (as pyridoxal-5-phosphate)   Vitamin B12 (as Methylcobalamin)   Citrulline Malate  CarnoSyn Beta-Alanine  Creatine Nitrate (NO3-T)  Betaine  Anhydrous   Nitrosigine Inositol-Stabilized Arginine Silicate  Arginine Alpha Ketoglutarate   Taurine  C4 [employer] Blend     N-Acetyl-L-Tyrosine      Caffeine Anhydrous    Velvet Bean (Mucuna pruriens) seed extract (standardized for L-Dopa)     Theacrine (as TeaCrine)      AlphaSize; A-GPC (Alpha-Glyceryl Phosphoryl Choline)      Zembrin (Sceletium tortuosum) aerial parts extract      Rauwolfia vomitoria root bark extract      Huperzine A (from Toothed [place] (Huperzia serrata) aerial parts extract)    [provider]  predniSONE (DELTASONE) 20 MG tablet Take 2 tablets (40 mg total) by mouth daily. 05/10/15   Antony Madura, PA-C      Allergies    Shellfish allergy    Review of Systems   Review of Systems Ten systems reviewed and are negative for acute change, except as noted in the HPI.    Physical Exam Updated Vital Signs BP 133/75 (BP Location: Left Arm)   Pulse 90   Temp 99.6 F (37.6 C) (Oral)   Resp 20   Ht 6\' 3"  (1.905 m)   Wt (!) 154.2 kg   SpO2 95%   BMI 42.50 kg/m   Physical Exam Vitals and nursing note reviewed.  Constitutional:      General: He is not in acute distress.    Appearance: He is well-developed. He is not diaphoretic.     Comments: Obese AA male  HENT:     Head: Normocephalic and atraumatic.  Eyes:  General: No scleral icterus.    Conjunctiva/sclera: Conjunctivae normal.  Cardiovascular:     Rate and Rhythm: Normal rate and regular rhythm.     Pulses: Normal pulses.  Pulmonary:     Effort: Pulmonary effort is normal. No respiratory distress.     Breath sounds: No stridor. Wheezing present.     Comments: Faint expiratory wheeze scattered in all lung fields.  Chest expansion symmetric.  No rales noted. Musculoskeletal:        General: Normal range of motion.     Cervical back: Normal range of motion.  Skin:    General: Skin is warm and dry.     Coloration: Skin is not pale.     Findings: No erythema or  rash.  Neurological:     Mental Status: He is alert and oriented to person, place, and time.     Coordination: Coordination normal.  Psychiatric:        Behavior: Behavior normal.     ED Results / Procedures / Treatments   Labs (all labs ordered are listed, but only abnormal results are displayed) Labs Reviewed  SARS CORONAVIRUS 2 BY RT PCR - Abnormal; Notable for the following components:      Result Value   SARS Coronavirus 2 by RT PCR POSITIVE (*)    All other components within normal limits    EKG None  Radiology No results found.  Procedures Procedures    Medications Ordered in ED Medications - No data to display  ED Course/ Medical Decision Making/ A&P                           Medical Decision Making Risk Prescription drug management.   This patient presents to the ED for concern of fatigue and myalgias, this involves an extensive number of treatment options, and is a complaint that carries with it a high risk of complications and morbidity.  The differential diagnosis includes COVID or other viral illness vs rhabdomyolysis    Co morbidities that complicate the patient evaluation  Obesity    Lab Tests:  I Ordered, and personally interpreted labs.  The pertinent results include:  positive COVID PCR   Medicines ordered and prescription drug management:  I ordered medication including ibuprofen for myalgias, albuterol MDI for wheezing, cough  Reevaluation of the patient after these medicines showed that the patient stayed the same I have reviewed the patients home medicines and have made adjustments as needed   Test Considered:  CXR   Reevaluation:  After the interventions noted above, I reevaluated the patient and found that they have : remained stable   Social Determinants of Health:  Unvaccinated    Dispostion:  After consideration of the diagnostic results and the patients response to treatment, I feel that the patent would benefit  from outpatient supportive care.  Counseled on use of Tylenol and ibuprofen for fever and body aches.  Instructed patient to use inhaler every 4-6 hours for wheezing, cough, shortness of breath.  Stable for follow-up with a PCP as an outpatient.  Return precautions discussed and provided. Patient discharged in stable condition with no unaddressed concerns.         Final Clinical Impression(s) / ED Diagnoses Final diagnoses:  COVID-19 virus infection    Rx / DC Orders ED Discharge Orders     None         Antony Madura, PA-C 08/31/21 Tarri Abernethy, April, MD  08/31/21 0705
